# Patient Record
Sex: Male | Born: 1955 | Race: White | Hispanic: Yes | Smoking: Never smoker
Health system: Southern US, Community
[De-identification: ages and names within clinical notes are randomized; demographics above are authoritative.]

---

## 2016-09-27 ENCOUNTER — Ambulatory Visit (INDEPENDENT_AMBULATORY_CARE_PROVIDER_SITE_OTHER): Payer: Self-pay

## 2016-09-27 ENCOUNTER — Ambulatory Visit (INDEPENDENT_AMBULATORY_CARE_PROVIDER_SITE_OTHER): Payer: Self-pay | Admitting: Urgent Care

## 2016-09-27 ENCOUNTER — Encounter: Payer: Self-pay | Admitting: Urgent Care

## 2016-09-27 VITALS — BP 114/71 | HR 57 | Temp 97.9°F | Resp 16 | Ht 67.0 in | Wt 141.0 lb

## 2016-09-27 DIAGNOSIS — M549 Dorsalgia, unspecified: Secondary | ICD-10-CM

## 2016-09-27 DIAGNOSIS — M79641 Pain in right hand: Secondary | ICD-10-CM

## 2016-09-27 DIAGNOSIS — M25441 Effusion, right hand: Secondary | ICD-10-CM

## 2016-09-27 DIAGNOSIS — M792 Neuralgia and neuritis, unspecified: Secondary | ICD-10-CM

## 2016-09-27 DIAGNOSIS — M19041 Primary osteoarthritis, right hand: Secondary | ICD-10-CM

## 2016-09-27 DIAGNOSIS — M5030 Other cervical disc degeneration, unspecified cervical region: Secondary | ICD-10-CM

## 2016-09-27 LAB — POCT URINALYSIS DIP (MANUAL ENTRY)
BILIRUBIN UA: NEGATIVE mg/dL
Bilirubin, UA: NEGATIVE
Blood, UA: NEGATIVE
Glucose, UA: NEGATIVE mg/dL
LEUKOCYTES UA: NEGATIVE
Nitrite, UA: NEGATIVE
PH UA: 6 (ref 5.0–8.0)
PROTEIN UA: NEGATIVE mg/dL
Spec Grav, UA: 1.02 (ref 1.010–1.025)
UROBILINOGEN UA: 0.2 U/dL

## 2016-09-27 LAB — POC MICROSCOPIC URINALYSIS (UMFC): Mucus: ABSENT

## 2016-09-27 LAB — POCT CBC
GRANULOCYTE PERCENT: 63.3 % (ref 37–80)
HEMATOCRIT: 45.6 % (ref 43.5–53.7)
Hemoglobin: 16 g/dL (ref 14.1–18.1)
Lymph, poc: 1.4 (ref 0.6–3.4)
MCH, POC: 33.7 pg — AB (ref 27–31.2)
MCHC: 35 g/dL (ref 31.8–35.4)
MCV: 96.2 fL (ref 80–97)
MID (CBC): 0.4 (ref 0–0.9)
MPV: 6.8 fL (ref 0–99.8)
POC GRANULOCYTE: 3 (ref 2–6.9)
POC LYMPH %: 29.2 % (ref 10–50)
POC MID %: 7.5 % (ref 0–12)
Platelet Count, POC: 202 10*3/uL (ref 142–424)
RBC: 4.74 M/uL (ref 4.69–6.13)
RDW, POC: 13 %
WBC: 4.7 10*3/uL (ref 4.6–10.2)

## 2016-09-27 MED ORDER — PREDNISONE 20 MG PO TABS: ORAL_TABLET | ORAL | 0 refills | Status: AC

## 2016-09-27 NOTE — Progress Notes (Signed)
MRN: [id number] DOB: 10-Apr-1955  Subjective:   Trevor Scott is a 61 y.o. male presenting for chief complaint of Hand Pain (x2 weeks; ) and Back Pain (x2 weeks)  Back Pain - Reports 2 week history of right sided back pain. Pain is intermittent, comes on randomly, is sharp, lasts about 1 hour. Pain radiates into right arm at times, both the elbow down to the wrist. Also feels that he has lost strength, has weak right arm. Has tried otc anti-inflammatory medications. Denies fever, falls, trauma, numbness or tingling, dysuria, hematuria. Patient used to work in [employer] but is no longer working. Has not worked for several years.  Hand Pain - Reports 2 week history of hand pain. Has finger swelling currently of the middle finger. Reports redness as well. Denies fever, insect bites, trauma. Patient eats vegetables mainly. Denies drinking alcohol. Denies history of gout.  Trevor Scott is not currently taking any medications. Also has No Known Allergies. Trevor Scott denies past medical and surgical history.  Objective:   Vitals: BP 114/71   Pulse (!) 57   Temp 97.9 F (36.6 C) (Oral)   Resp 16   Ht 5\' 7"  (1.702 m)   Wt 141 lb (64 kg)   SpO2 98%   BMI 22.08 kg/m   Physical Exam  Constitutional: He is oriented to person, place, and time. He appears well-developed and well-nourished.  Cardiovascular: Normal rate.   Pulmonary/Chest: Effort normal.  Musculoskeletal:       Cervical back: He exhibits decreased range of motion (extension, lateral rotation). He exhibits no tenderness, no bony tenderness, no swelling, no edema, no deformity and no spasm.       Right hand: He exhibits swelling (non-specific at PIP and DIP joints of 2nd-4th fingers). He exhibits normal range of motion, no tenderness, no bony tenderness, normal two-point discrimination, normal capillary refill and no deformity. Normal sensation noted. Normal strength noted.  Neurological: He is alert and oriented to person, place, and time.  He displays normal reflexes. Coordination normal.  Skin: Skin is warm and dry.  Psychiatric: He has a normal mood and affect.   Dg Cervical Spine Complete  Result Date: 09/27/2016 CLINICAL DATA:  Neck and mid back pain. EXAM: CERVICAL SPINE - COMPLETE 4+ VIEW COMPARISON:  None. FINDINGS: There is no evidence of cervical spine fracture or prevertebral soft tissue swelling, accounting was suboptimal odontoid view. Alignment is normal. Multilevel osteoarthritic changes with disc space narrowing, remodeling of vertebral bodies and posterior facet arthropathy. Mild osseous neural foraminal narrowing at C2-C3, C3-C4, and C4-C5 bilaterally. IMPRESSION: No evidence of fracture of the cervical spine. Multilevel osteoarthritic changes with posterior facet arthropathy and subsequent osseous neural foraminal narrowing in the upper cervical spine. Electronically Signed   By: Ted Mcalpine M.D.   On: 09/27/2016 11:32   Dg Thoracic Spine 2 View  Result Date: 09/27/2016 CLINICAL DATA:  Ran into something Neck and mid back pain. Rt hand painful and swollen EXAM: THORACIC SPINE 2 VIEWS COMPARISON:  None. FINDINGS: No fracture.  No spondylolisthesis. Bones are demineralized. There are mild degenerative changes reflected by anterior endplate osteophytes mostly along the lower thoracic spine. Soft tissues are unremarkable. IMPRESSION: No fracture, spondylolisthesis or acute finding. Electronically Signed   By: Amie Portland M.D.   On: 09/27/2016 11:30   Dg Hand Complete Right  Result Date: 09/27/2016 EXAM: RIGHT HAND - COMPLETE 3+ VIEW COMPARISON:  None. FINDINGS: No fracture.  No dislocation. Mild asymmetric joint space narrowing most evident at the PIP joints  of the middle and ring fingers, with small associated marginal osteophytes. There is soft tissue swelling. This is most evident along the radial aspect of the proximal middle finger, but also seen adjacent to several of the interphalangeal joints. IMPRESSION:  1. No fracture or dislocation. 2. Mild osteoarthritis most evident involving the PIP joints of the middle and ring fingers. 3. Periarticular soft tissue swelling. The most significant soft tissue swelling is along the radial base of the middle finger, nonspecific. Electronically Signed   By: Amie Portlandavid  Ormond M.D.   On: 09/27/2016 11:32    Results for orders placed or performed in visit on 09/27/16 (from the past 24 hour(s))  POCT CBC     Status: Abnormal   Collection Time: 09/27/16 10:49 AM  Result Value Ref Range   WBC 4.7 4.6 - 10.2 K/uL   Lymph, poc 1.4 0.6 - 3.4   POC LYMPH PERCENT 29.2 10 - 50 %L   MID (cbc) 0.4 0 - 0.9   POC MID % 7.5 0 - 12 %M   POC Granulocyte 3.0 2 - 6.9   Granulocyte percent 63.3 37 - 80 %G   RBC 4.74 4.69 - 6.13 M/uL   Hemoglobin 16.0 14.1 - 18.1 g/dL   HCT, POC 44.045.6 10.243.5 - 53.7 %   MCV 96.2 80 - 97 fL   MCH, POC 33.7 (A) 27 - 31.2 pg   MCHC 35.0 31.8 - 35.4 g/dL   RDW, POC 72.513.0 %   Platelet Count, POC 202 142 - 424 K/uL   MPV 6.8 0 - 99.8 fL  POCT Microscopic Urinalysis ([facility])     Status: Abnormal   Collection Time: 09/27/16 11:46 AM  Result Value Ref Range   WBC,UR,HPF,POC None None WBC/hpf   RBC,UR,HPF,POC None None RBC/hpf   Bacteria None None, Too numerous to count   Mucus Absent Absent   Epithelial Cells, UR Per Microscopy Few (A) None, Too numerous to count cells/hpf  POCT urinalysis dipstick     Status: None   Collection Time: 09/27/16 11:46 AM  Result Value Ref Range   Color, UA yellow yellow   Clarity, UA clear clear   Glucose, UA negative negative mg/dL   Bilirubin, UA negative negative   Ketones, POC UA negative negative mg/dL   Spec Grav, UA 3.6641.020 4.0341.010 - 1.025   Blood, UA negative negative   pH, UA 6.0 5.0 - 8.0   Protein Ur, POC negative negative mg/dL   Urobilinogen, UA 0.2 0.2 or 1.0 E.U./dL   Nitrite, UA Negative Negative   Leukocytes, UA Negative Negative   Assessment and Plan :   1. Acute right-sided back pain, unspecified  back location 2. Radicular pain in right arm 3. Hand pain, right 4. Swelling of joint of right hand 5. Degenerative disc disease, cervical 6. Osteoarthritis of right hand, unspecified osteoarthritis type - Will start patient on short steroid course given radicular symptoms and radiology report. This should also help arthritis in his hand. Patient will f/u in 2 weeks to review labs. Return-to-clinic precautions discussed, patient verbalized understanding.   Wallis BambergMario Albirda Shiel, PA-C [facility] [phone] 09/27/2016  10:37 AM

## 2016-09-27 NOTE — Patient Instructions (Addendum)
After you are finished with prednisone (5 days), you may take 500mg  Tylenol with ibuprofen 400-600mg  every 6 hours for pain and inflammation associated with your arthritis.   Degenerative Disk Disease Degenerative disk disease is a condition caused by the changes that occur in spinal disks as you grow older. Spinal disks are soft and compressible disks located between the bones of your spine (vertebrae). These disks act like shock absorbers. Degenerative disk disease can affect the whole spine. However, the neck and lower back are most commonly affected. Many changes can occur in the spinal disks with aging, such as:  The spinal disks may dry and shrink.  Small tears may occur in the tough, outer covering of the disk (annulus).  The disk space may become smaller due to loss of water.  Abnormal growths in the bone (spurs) may occur. This can put pressure on the nerve roots exiting the spinal canal, causing pain.  The spinal canal may become narrowed.  What increases the risk?  Being overweight.  Having a family history of degenerative disk disease.  Smoking.  There is increased risk if you are doing heavy lifting or have a sudden injury. What are the signs or symptoms? Symptoms vary from person to person and may include:  Pain that varies in intensity. Some people have no pain, while others have severe pain. The location of the pain depends on the part of your backbone that is affected. ? You will have neck or arm pain if a disk in the neck area is affected. ? You will have pain in your back, buttocks, or legs if a disk in the lower back is affected.  Pain that becomes worse while bending, reaching up, or with twisting movements.  Pain that may start gradually and then get worse as time passes. It may also start after a major or minor injury.  Numbness or tingling in the arms or legs.  How is this diagnosed? Your health care provider will ask you about your symptoms and about  activities or habits that may cause the pain. He or she may also ask about any injuries, diseases, or treatments you have had. Your health care provider will examine you to check for the range of movement that is possible in the affected area, to check for strength in your extremities, and to check for sensation in the areas of the arms and legs supplied by different nerve roots. You may also have:  An X-ray of the spine.  Other imaging tests, such as MRI.  How is this treated? Your health care provider will advise you on the best plan for treatment. Treatment may include:  Medicines.  Rehabilitation exercises.  Follow these instructions at home:  Follow proper lifting and walking techniques as advised by your health care provider.  Maintain good posture.  Exercise regularly as advised by your health care provider.  Perform relaxation exercises.  Change your sitting, standing, and sleeping habits as advised by your health care provider.  Change positions frequently.  Lose weight or maintain a healthy weight as advised by your health care provider.  Do not use any tobacco products, including cigarettes, chewing tobacco, or electronic cigarettes. If you need help quitting, ask your health care provider.  Wear supportive footwear.  Take medicines only as directed by your health care provider. Contact a health care provider if:  Your pain does not go away within 1-4 weeks.  You have significant appetite or weight loss. Get help right away if:  Your pain is severe.  You notice weakness in your arms, hands, or legs.  You begin to lose control of your bladder or bowel movements.  You have fevers or night sweats. This information is not intended to replace advice given to you by your health care provider. Make sure you discuss any questions you have with your health care provider. Document Released: 01/12/2007 Document Revised: 08/23/2015 Document Reviewed:  07/19/2013 Elsevier Interactive Patient Education  2018 [employer].    Arthritis Arthritis is a term that is commonly used to refer to joint pain or joint disease. There are more than 100 types of arthritis. What are the causes? The most common cause of this condition is wear and tear of a joint. Other causes include:  Gout.  Inflammation of a joint.  An infection of a joint.  Sprains and other injuries near the joint.  A drug reaction or allergic reaction.  In some cases, the cause may not be known. What are the signs or symptoms? The main symptom of this condition is pain in the joint with movement. Other symptoms include:  Redness, swelling, or stiffness at a joint.  Warmth coming from the joint.  Fever.  Overall feeling of illness.  How is this diagnosed? This condition may be diagnosed with a physical exam and tests, including:  Blood tests.  Urine tests.  Imaging tests, such as MRI, X-rays, or a CT scan.  Sometimes, fluid is removed from a joint for testing. How is this treated? Treatment for this condition may involve:  Treatment of the cause, if it is known.  Rest.  Raising (elevating) the joint.  Applying cold or hot packs to the joint.  Medicines to improve symptoms and reduce inflammation.  Injections of a steroid such as cortisone into the joint to help reduce pain and inflammation.  Depending on the cause of your arthritis, you may need to make lifestyle changes to reduce stress on your joint. These changes may include exercising more and losing weight. Follow these instructions at home: Medicines  Take over-the-counter and prescription medicines only as told by your health care provider.  Do not take aspirin to relieve pain if gout is suspected. Activity  Rest your joint if told by your health care provider. Rest is important when your disease is active and your joint feels painful, swollen, or stiff.  Avoid activities that make the  pain worse. It is important to balance activity with rest.  Exercise your joint regularly with range-of-motion exercises as told by your health care provider. Try doing low-impact exercise, such as: ? Swimming. ? Water aerobics. ? Biking. ? Walking. Joint Care   If your joint is swollen, keep it elevated if told by your health care provider.  If your joint feels stiff in the morning, try taking a warm shower.  If directed, apply heat to the joint. If you have diabetes, do not apply heat without permission from your health care provider. ? Put a towel between the joint and the hot pack or heating pad. ? Leave the heat on the area for 20-30 minutes.  If directed, apply ice to the joint: ? Put ice in a plastic bag. ? Place a towel between your skin and the bag. ? Leave the ice on for 20 minutes, 2-3 times per day.  Keep all follow-up visits as told by your health care provider. This is important. Contact a health care provider if:  The pain gets worse.  You have a fever. Get  help right away if:  You develop severe joint pain, swelling, or redness.  Many joints become painful and swollen.  You develop severe back pain.  You develop severe weakness in your leg.  You cannot control your bladder or bowels. This information is not intended to replace advice given to you by your health care provider. Make sure you discuss any questions you have with your health care provider. Document Released: 04/24/2004 Document Revised: 08/23/2015 Document Reviewed: 06/12/2014 Elsevier Interactive Patient Education  2018 [employer]     IF you received an x-ray today, you will receive an invoice from [facility]. Please contact [facility] at [phone] with questions or concerns regarding your invoice.   IF you received labwork today, you will receive an invoice from [facility]. Please contact [facility] at [phone] with questions or concerns regarding your invoice.    Our billing staff will not be able to assist you with questions regarding bills from these companies.  You will be contacted with the lab results as soon as they are available. The fastest way to get your results is to activate your My Chart account. Instructions are located on the last page of this paperwork. If you have not heard from us regarding the results in 2 weeks, please contact this office.

## 2016-09-28 LAB — COMPREHENSIVE METABOLIC PANEL
ALBUMIN: 4.2 g/dL (ref 3.6–4.8)
ALK PHOS: 67 IU/L (ref 39–117)
ALT: 17 IU/L (ref 0–44)
AST: 22 IU/L (ref 0–40)
Albumin/Globulin Ratio: 1.6 (ref 1.2–2.2)
BUN / CREAT RATIO: 19 (ref 10–24)
BUN: 16 mg/dL (ref 8–27)
Bilirubin Total: 0.6 mg/dL (ref 0.0–1.2)
CO2: 22 mmol/L (ref 20–29)
CREATININE: 0.86 mg/dL (ref 0.76–1.27)
Calcium: 8.9 mg/dL (ref 8.6–10.2)
Chloride: 105 mmol/L (ref 96–106)
GFR calc Af Amer: 107 mL/min/{1.73_m2} (ref >59)
GFR calc non Af Amer: 92 mL/min/{1.73_m2} (ref >59)
GLUCOSE: 87 mg/dL (ref 65–99)
Globulin, Total: 2.7 g/dL (ref 1.5–4.5)
Potassium: 4.1 mmol/L (ref 3.5–5.2)
Sodium: 141 mmol/L (ref 134–144)
Total Protein: 6.9 g/dL (ref 6.0–8.5)

## 2016-09-28 LAB — SEDIMENTATION RATE: SED RATE: 3 mm/h (ref 0–30)

## 2016-09-28 LAB — CK: CK TOTAL: 88 U/L (ref 24–204)

## 2016-09-28 LAB — RHEUMATOID FACTOR: Rhuematoid fact SerPl-aCnc: <10 IU/mL (ref 0.0–13.9)

## 2016-09-28 LAB — URIC ACID: Uric Acid: 4.9 mg/dL (ref 3.7–8.6)

## 2016-10-07 ENCOUNTER — Encounter: Payer: Self-pay | Admitting: Urgent Care

## 2018-12-05 IMAGING — DX DG HAND COMPLETE 3+V*R*
3 series · 3 of 3 positions shown · non-contrast
Comparison: None.

EXAM:
RIGHT HAND - COMPLETE 3+ VIEW

[hand pa]
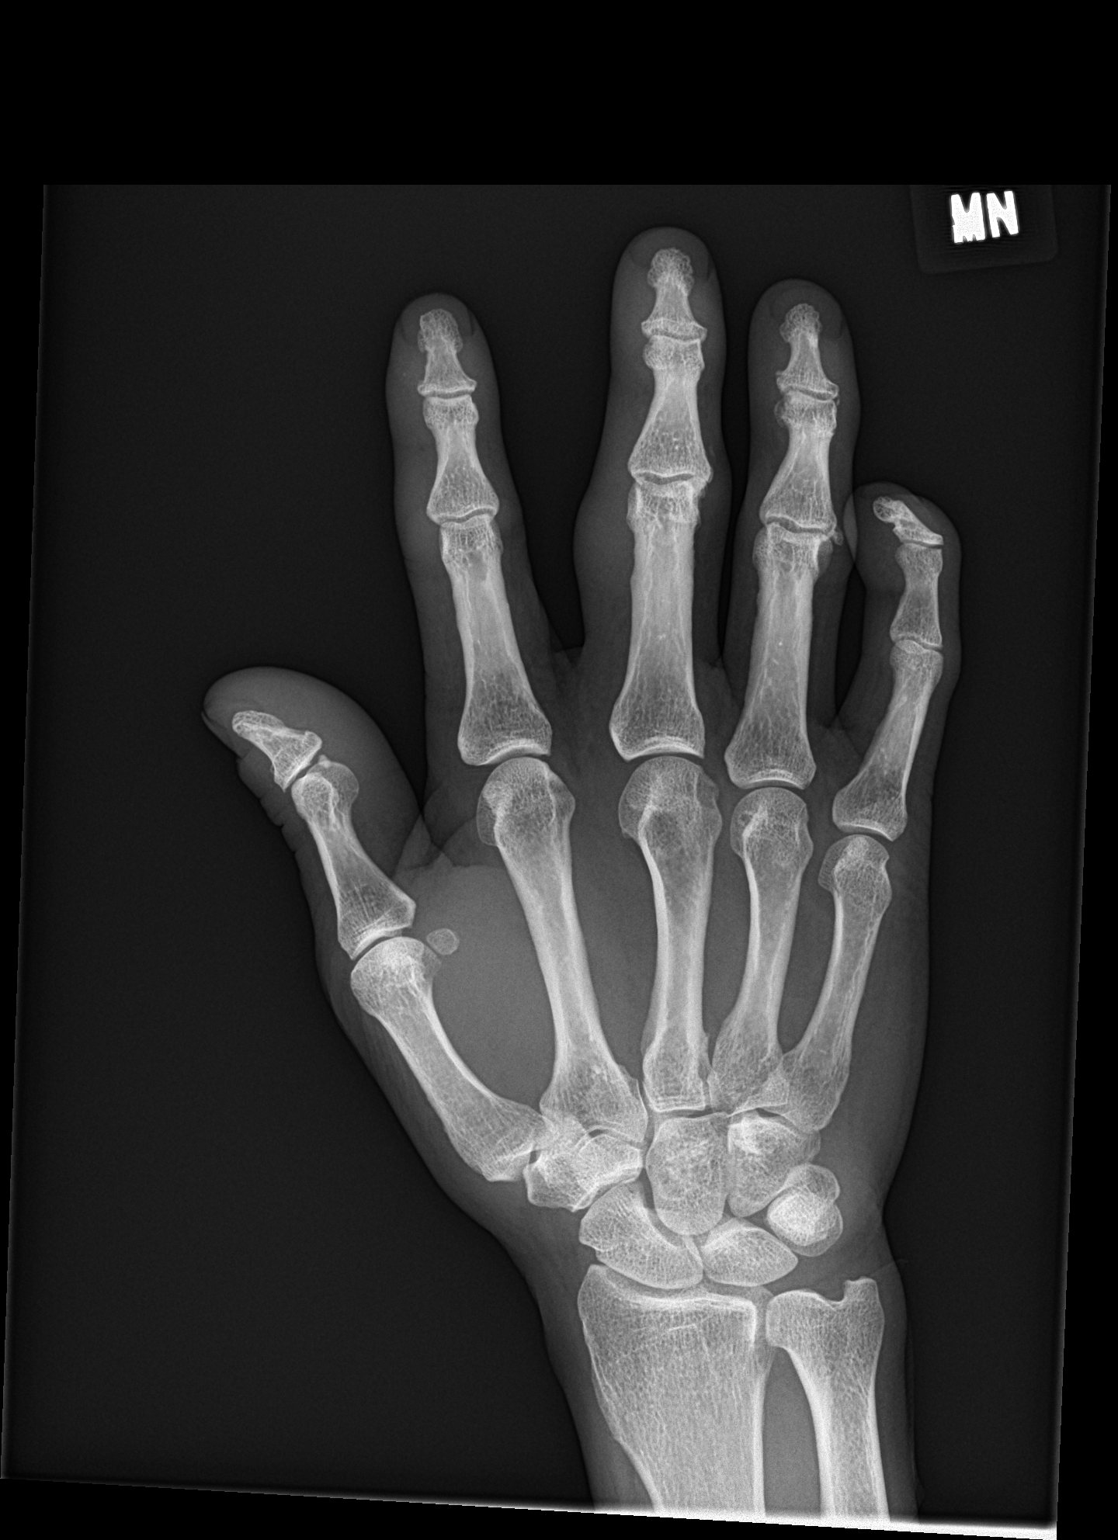

[hand obl]
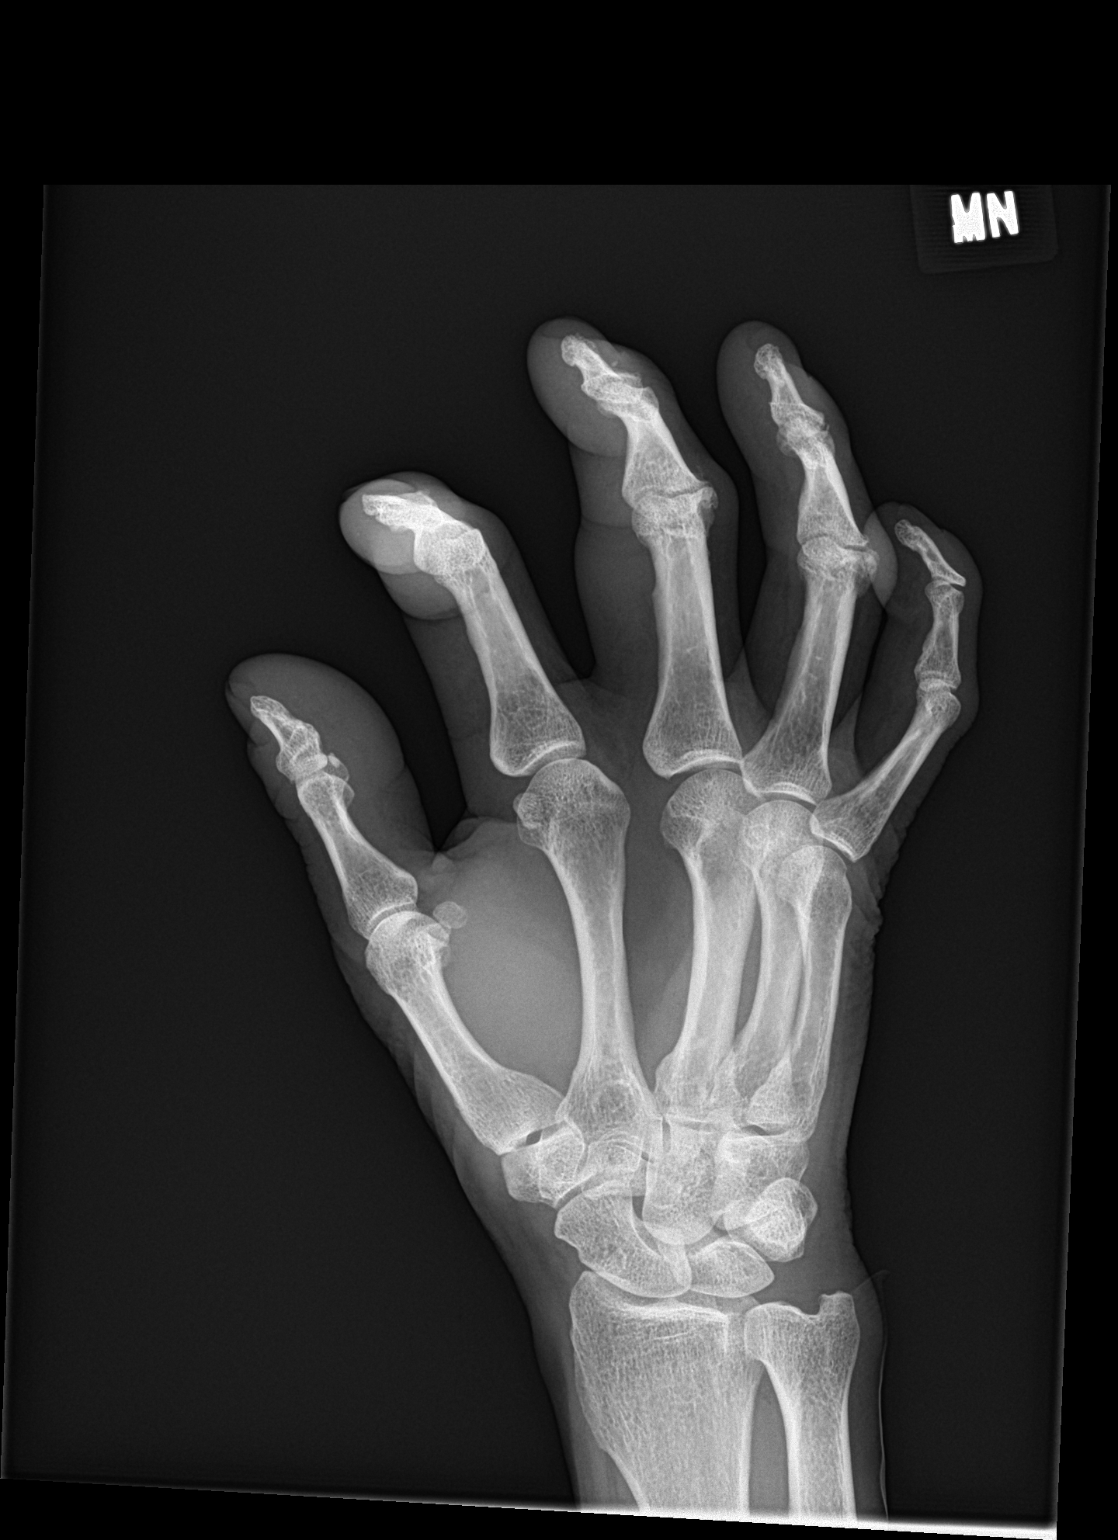

[hand lat]
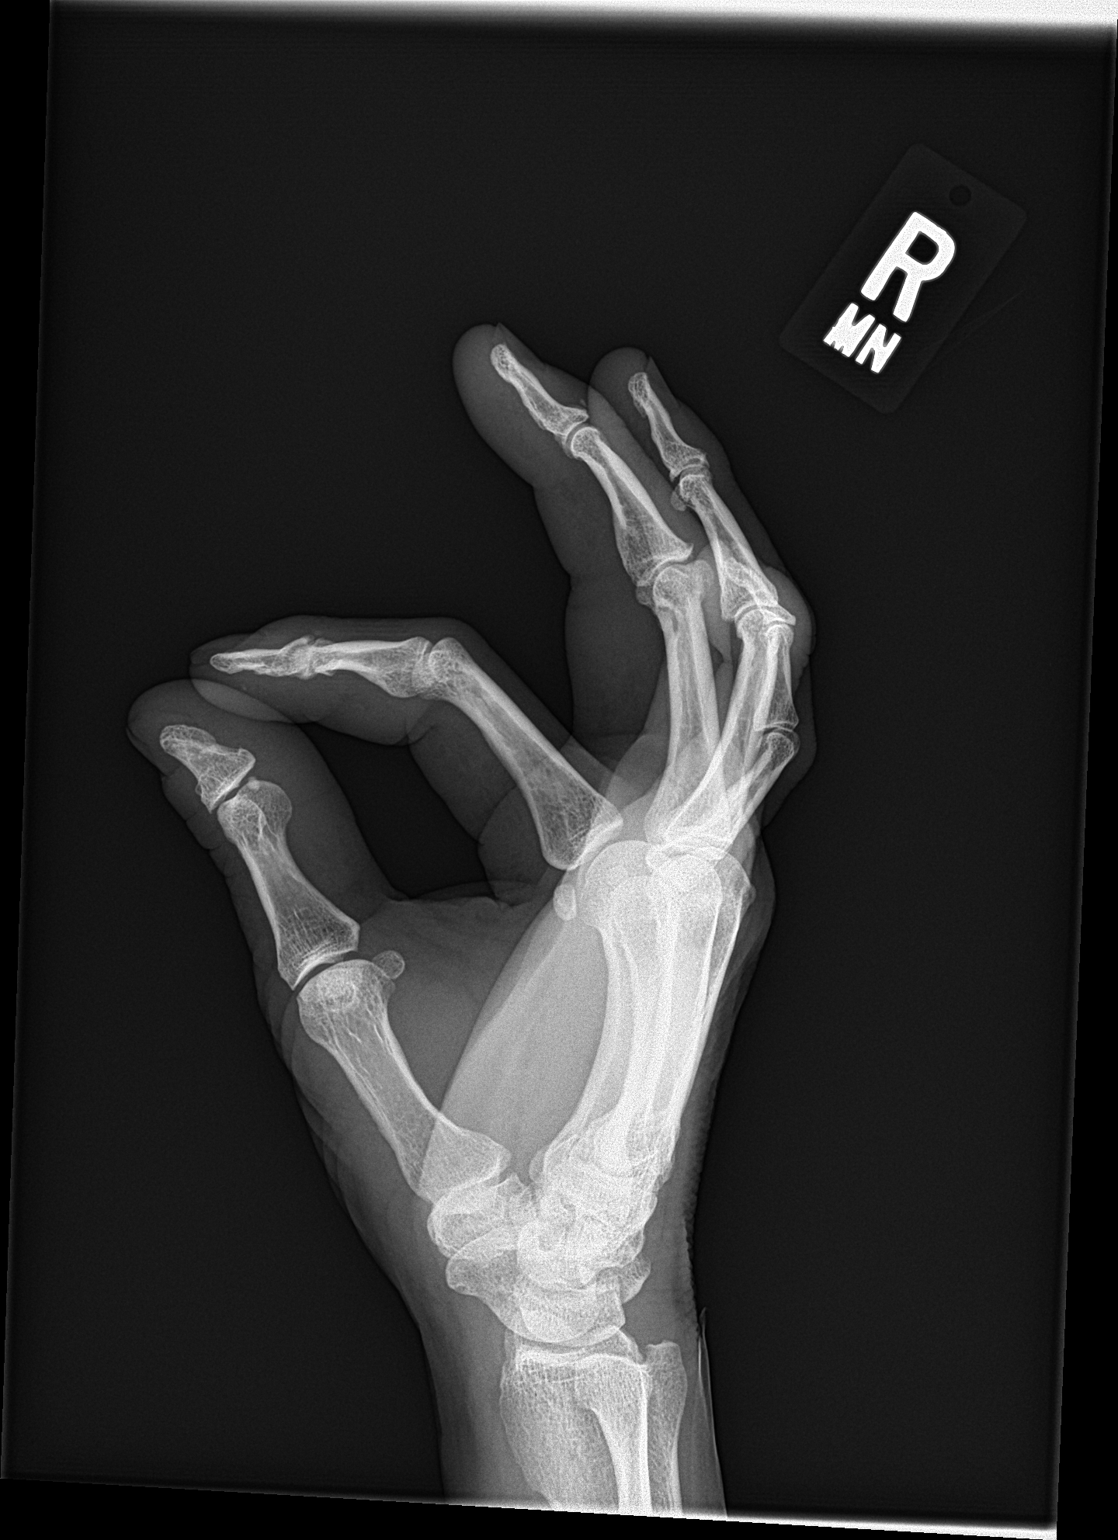

[3 of 3 positions shown; findings below may reference images not displayed]

FINDINGS: No fracture.  No dislocation.

Mild asymmetric joint space narrowing most evident at the PIP joints
of the middle and ring fingers, with small associated marginal
osteophytes.

There is soft tissue swelling. This is most evident along the radial
aspect of the proximal middle finger, but also seen adjacent to
several of the interphalangeal joints.
IMPRESSION: 1. No fracture or dislocation.
2. Mild osteoarthritis most evident involving the PIP joints of the
middle and ring fingers.
3. Periarticular soft tissue swelling. The most significant soft
tissue swelling is along the radial base of the middle finger,
nonspecific.
